# Patient Record
Sex: Female | Born: 1940 | Race: White | Hispanic: No | Marital: Married | Smoking: Never smoker
Health system: Southern US, Community
[De-identification: ages and names within clinical notes are randomized; demographics above are authoritative.]

## PROBLEM LIST (undated history)

## (undated) DIAGNOSIS — A159 Respiratory tuberculosis unspecified: Secondary | ICD-10-CM

## (undated) DIAGNOSIS — C189 Malignant neoplasm of colon, unspecified: Secondary | ICD-10-CM

## (undated) DIAGNOSIS — H409 Unspecified glaucoma: Secondary | ICD-10-CM

## (undated) DIAGNOSIS — M5430 Sciatica, unspecified side: Secondary | ICD-10-CM

## (undated) DIAGNOSIS — G459 Transient cerebral ischemic attack, unspecified: Secondary | ICD-10-CM

## (undated) DIAGNOSIS — N281 Cyst of kidney, acquired: Secondary | ICD-10-CM

## (undated) DIAGNOSIS — J45909 Unspecified asthma, uncomplicated: Secondary | ICD-10-CM

## (undated) DIAGNOSIS — K5790 Diverticulosis of intestine, part unspecified, without perforation or abscess without bleeding: Secondary | ICD-10-CM

## (undated) DIAGNOSIS — K449 Diaphragmatic hernia without obstruction or gangrene: Secondary | ICD-10-CM

## (undated) DIAGNOSIS — I10 Essential (primary) hypertension: Secondary | ICD-10-CM

## (undated) DIAGNOSIS — K635 Polyp of colon: Secondary | ICD-10-CM

## (undated) DIAGNOSIS — J439 Emphysema, unspecified: Secondary | ICD-10-CM

## (undated) DIAGNOSIS — Z5189 Encounter for other specified aftercare: Secondary | ICD-10-CM

## (undated) DIAGNOSIS — C787 Secondary malignant neoplasm of liver and intrahepatic bile duct: Secondary | ICD-10-CM

## (undated) HISTORY — DX: Essential (primary) hypertension: I10

## (undated) HISTORY — PX: CHOLECYSTECTOMY: SHX55

## (undated) HISTORY — DX: Diaphragmatic hernia without obstruction or gangrene: K44.9

## (undated) HISTORY — DX: Unspecified glaucoma: H40.9

## (undated) HISTORY — PX: COLONOSCOPY: SHX174

## (undated) HISTORY — DX: Sciatica, unspecified side: M54.30

## (undated) HISTORY — PX: TUBAL LIGATION: SHX77

## (undated) HISTORY — DX: Diverticulosis of intestine, part unspecified, without perforation or abscess without bleeding: K57.90

## (undated) HISTORY — DX: Encounter for other specified aftercare: Z51.89

## (undated) HISTORY — DX: Emphysema, unspecified: J43.9

## (undated) HISTORY — PX: APPENDECTOMY: SHX54

## (undated) HISTORY — DX: Unspecified asthma, uncomplicated: J45.909

## (undated) HISTORY — DX: Secondary malignant neoplasm of liver and intrahepatic bile duct: C78.7

## (undated) HISTORY — DX: Cyst of kidney, acquired: N28.1

## (undated) HISTORY — DX: Malignant neoplasm of colon, unspecified: C18.9

## (undated) HISTORY — DX: Polyp of colon: K63.5

## (undated) HISTORY — PX: TONSILLECTOMY: SUR1361

## (undated) HISTORY — DX: Respiratory tuberculosis unspecified: A15.9

---
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
## 2010-05-28 HISTORY — PX: COLON SURGERY: SHX602

## 2010-07-05 ENCOUNTER — Ambulatory Visit (HOSPITAL_COMMUNITY): Admission: RE | Admit: 2010-07-05 | Discharge: 2010-07-05 | Payer: Self-pay | Admitting: General Surgery

## 2010-07-18 ENCOUNTER — Ambulatory Visit (HOSPITAL_COMMUNITY): Admission: RE | Admit: 2010-07-18 | Discharge: 2010-07-18 | Payer: Self-pay | Admitting: General Surgery

## 2010-09-11 ENCOUNTER — Ambulatory Visit: Payer: Self-pay | Admitting: Genetic Counselor

## 2011-03-01 LAB — GLUCOSE, CAPILLARY: Glucose-Capillary: 109 mg/dL — ABNORMAL HIGH (ref 70–99)

## 2011-03-02 LAB — CBC
HCT: 36.4 % (ref 36.0–46.0)
Hemoglobin: 11.9 g/dL — ABNORMAL LOW (ref 12.0–15.0)
MCH: 27.9 pg (ref 26.0–34.0)
MCV: 85.2 fL (ref 78.0–100.0)
RBC: 4.27 MIL/uL (ref 3.87–5.11)

## 2011-06-27 IMAGING — PT NM PET TUM IMG INITIAL (PI) SKULL BASE T - THIGH
1 of 6 series · 1 of 25 positions shown · non-contrast
Comparison: Outside CT of the abdomen/pelvis from [facility]
dated 06/15/2010.

CLINICAL DATA: Initial treatment strategy for metastatic
adenocarcinoma of the colon.  Status post colectomy and liver
biopsy.

NUCLEAR MEDICINE PET CT SKULL BASE TO THIGH
TECHNIQUE: 16.9 mCi F-18 FDG was injected intravenously via the
right AC.  Full-ring PET imaging was performed from the skull base
through the mid-thighs 61  minutes after injection.  CT data was
obtained and used for attenuation correction and anatomic
localization only.  (This was not acquired as a diagnostic CT
examination.)
Fasting Blood Glucose:  109

[Series 2: ct images · axial · 3.8mm · 0.98mm/px · 1 of 267 slices shown]
[im 267/267  brain]
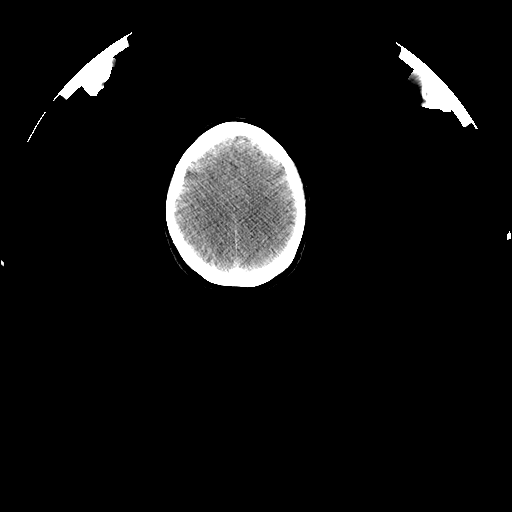

[1 of 25 positions shown; findings below may reference images not displayed]

FINDINGS: PET images demonstrate no abnormal activity within the
neck.  Focus of moderate hypermetabolism corresponds to the right
posterior hilum, primarily felt to represent the right pulmonary
artery.  No definite adenopathy on this area.  This measures a
S.U.V. max of 4.5 on image 89.  There is concurrent bilateral,
right greater than left apical and upper lobe anatomic distortion
with volume loss and bronchiectasis in the posterior aspect of the
right upper lobe.

Anterior segment right liver lobe 2.7 x 2.9 cm hypermetabolic
lesion measures a S.U.V. max of 25.2 on image 134.  This may have
enlarged slightly since the prior exam, where it measured 2.5 x
cm.

A subtle  posterior segment right liver lobe lesion measures 1.5 cm
and a S.U.V. max of 14.6 on image 141.  This may also slightly
enlarged from 7 mm on the prior diagnostic CT.  No new
hypermetabolic liver lesions. There is low level hypermetabolism
within the anterior right intraperitoneal abdomen.  This is likely
postoperative, given peritoneal/omental edema in this area.  An
area of somewhat more focal hypermetabolism is identified about the
deep portion of the right rectus musculature.  This measures a
S.U.V. max of 6.5 on approximately image 186.  This is also likely
physiologic.

A subcutaneous nodule within the lateral left flank measures 1.2 cm
and a S.U.V. max of 1.9 on image 188.

CT images performed for attenuation correction demonstrate tiny
left maxillary sinus mucous retention cyst or polyp.

No further findings within the chest.  Abdominal/pelvic findings
deferred to prior diagnostic CT.  Prior partial right
hemicolectomy.
IMPRESSION: 1.  2 hypermetabolic hepatic metastasis.  These may have enlarged
since the diagnostic CT of 06/15/2010.
[DATE].  Likely postoperative right-sided abdominal hypermetabolism.
Given concurrent omental/peritoneal nodularity/edema, peritoneal
disease cannot be entirely excluded but is felt less likely.
3.  A subcutaneous nodule within the lateral left flank is mildly
hypermetabolic but nonspecific.  Consider physical exam
correlation.
4.  Upper lobe predominant findings within the lungs which are
favored to be the sequelae of prior infection or less likely
inflammation.  Concurrent hypermetabolism which is felt to be
physiologic and correspond the right pulmonary artery. Recommend
attention on follow-up.

## 2011-12-17 HISTORY — PX: LIVER RESECTION: SHX1977

## 2011-12-26 HISTORY — PX: EXPLORATORY LAPAROTOMY: SUR591

## 2012-05-12 ENCOUNTER — Encounter: Payer: Medicare Other | Admitting: Internal Medicine

## 2012-05-12 DIAGNOSIS — C183 Malignant neoplasm of hepatic flexure: Secondary | ICD-10-CM

## 2012-05-12 DIAGNOSIS — Z452 Encounter for adjustment and management of vascular access device: Secondary | ICD-10-CM

## 2012-07-09 ENCOUNTER — Encounter: Payer: Medicare Other | Admitting: Internal Medicine

## 2012-07-09 DIAGNOSIS — C228 Malignant neoplasm of liver, primary, unspecified as to type: Secondary | ICD-10-CM

## 2012-08-27 DIAGNOSIS — C189 Malignant neoplasm of colon, unspecified: Secondary | ICD-10-CM

## 2012-08-27 DIAGNOSIS — Z452 Encounter for adjustment and management of vascular access device: Secondary | ICD-10-CM

## 2012-11-20 ENCOUNTER — Encounter: Payer: Medicare Other | Admitting: Internal Medicine

## 2012-11-20 DIAGNOSIS — C189 Malignant neoplasm of colon, unspecified: Secondary | ICD-10-CM

## 2012-11-20 DIAGNOSIS — C801 Malignant (primary) neoplasm, unspecified: Secondary | ICD-10-CM

## 2013-01-01 DIAGNOSIS — Z452 Encounter for adjustment and management of vascular access device: Secondary | ICD-10-CM

## 2013-01-01 DIAGNOSIS — C189 Malignant neoplasm of colon, unspecified: Secondary | ICD-10-CM

## 2013-02-11 DIAGNOSIS — Z452 Encounter for adjustment and management of vascular access device: Secondary | ICD-10-CM

## 2013-02-11 DIAGNOSIS — C183 Malignant neoplasm of hepatic flexure: Secondary | ICD-10-CM

## 2013-03-29 DIAGNOSIS — Z452 Encounter for adjustment and management of vascular access device: Secondary | ICD-10-CM

## 2013-03-29 DIAGNOSIS — C189 Malignant neoplasm of colon, unspecified: Secondary | ICD-10-CM

## 2013-05-06 DIAGNOSIS — C787 Secondary malignant neoplasm of liver and intrahepatic bile duct: Secondary | ICD-10-CM

## 2013-05-06 DIAGNOSIS — Z452 Encounter for adjustment and management of vascular access device: Secondary | ICD-10-CM

## 2013-05-06 DIAGNOSIS — C189 Malignant neoplasm of colon, unspecified: Secondary | ICD-10-CM

## 2013-06-16 ENCOUNTER — Encounter: Payer: Medicare Other | Admitting: Internal Medicine

## 2013-06-16 DIAGNOSIS — C189 Malignant neoplasm of colon, unspecified: Secondary | ICD-10-CM

## 2013-07-28 DIAGNOSIS — Z452 Encounter for adjustment and management of vascular access device: Secondary | ICD-10-CM

## 2013-07-28 DIAGNOSIS — C189 Malignant neoplasm of colon, unspecified: Secondary | ICD-10-CM

## 2013-09-08 DIAGNOSIS — Z452 Encounter for adjustment and management of vascular access device: Secondary | ICD-10-CM

## 2013-09-08 DIAGNOSIS — C189 Malignant neoplasm of colon, unspecified: Secondary | ICD-10-CM

## 2013-09-13 DIAGNOSIS — C189 Malignant neoplasm of colon, unspecified: Secondary | ICD-10-CM

## 2013-09-13 DIAGNOSIS — R071 Chest pain on breathing: Secondary | ICD-10-CM

## 2013-09-13 DIAGNOSIS — R1011 Right upper quadrant pain: Secondary | ICD-10-CM

## 2013-09-13 DIAGNOSIS — C787 Secondary malignant neoplasm of liver and intrahepatic bile duct: Secondary | ICD-10-CM

## 2013-09-21 ENCOUNTER — Other Ambulatory Visit (HOSPITAL_COMMUNITY): Payer: Self-pay | Admitting: Hematology and Oncology

## 2013-09-21 DIAGNOSIS — R1011 Right upper quadrant pain: Secondary | ICD-10-CM

## 2013-09-21 DIAGNOSIS — R748 Abnormal levels of other serum enzymes: Secondary | ICD-10-CM

## 2013-09-21 DIAGNOSIS — C787 Secondary malignant neoplasm of liver and intrahepatic bile duct: Secondary | ICD-10-CM

## 2013-09-21 DIAGNOSIS — C189 Malignant neoplasm of colon, unspecified: Secondary | ICD-10-CM

## 2013-10-05 ENCOUNTER — Encounter (HOSPITAL_COMMUNITY)
Admission: RE | Admit: 2013-10-05 | Discharge: 2013-10-05 | Disposition: A | Discharge status: Home / Self Care | Payer: Medicare Other | Source: Ambulatory Visit | Attending: Hematology and Oncology | Admitting: Hematology and Oncology

## 2013-10-05 DIAGNOSIS — C189 Malignant neoplasm of colon, unspecified: Secondary | ICD-10-CM | POA: Insufficient documentation

## 2013-10-05 DIAGNOSIS — C787 Secondary malignant neoplasm of liver and intrahepatic bile duct: Secondary | ICD-10-CM | POA: Insufficient documentation

## 2013-10-05 MED ORDER — FLUDEOXYGLUCOSE F - 18 (FDG) INJECTION
19.4000 | Freq: Once | INTRAVENOUS | Status: AC | PRN
  Administered 2013-10-05: 19.4 via INTRAVENOUS

## 2013-10-20 DIAGNOSIS — C787 Secondary malignant neoplasm of liver and intrahepatic bile duct: Secondary | ICD-10-CM

## 2013-10-20 DIAGNOSIS — Z452 Encounter for adjustment and management of vascular access device: Secondary | ICD-10-CM

## 2013-10-20 DIAGNOSIS — R05 Cough: Secondary | ICD-10-CM

## 2013-10-20 DIAGNOSIS — C189 Malignant neoplasm of colon, unspecified: Secondary | ICD-10-CM

## 2015-09-16 DIAGNOSIS — G459 Transient cerebral ischemic attack, unspecified: Secondary | ICD-10-CM

## 2015-09-16 HISTORY — DX: Transient cerebral ischemic attack, unspecified: G45.9

## 2019-05-14 ENCOUNTER — Telehealth: Payer: Self-pay | Admitting: *Deleted

## 2019-05-14 NOTE — Telephone Encounter (Signed)
Tele visit 6-12 3 pm with Dr Carlean Purl--  Pt is okay with Zoom visit as well [facility]

## 2019-05-14 NOTE — Telephone Encounter (Signed)
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  12/03/10: SBRT to two lesions in the liver 50 Gy/5 fxs  12/26/11: Laparotomy with IOUS of liver wedge resection and cholecystectomy  History: Emily Christian returns to clinic today for routine follow up y and has not had any evidence of disease recurrence. She was being followed by Dr. Sonny Dandy in [place], but he has retired and the [facility] center in [place] has closed. She had a colonoscopy 1 year ago, which had no evidence of disease recurrence- just some benign polyps.    She is having issues with feeling full and constipation and is wanting an OV.. I cannot see any colon reports but she can get them from [facility] in [place].  With waht we have, can we schedule her an OV with you?  Please advise, thanks for your time,  Lelan Pons

## 2019-05-14 NOTE — Telephone Encounter (Signed)
[place] YOU!!!!!!>    Do you want her to come in or do over the phone?    I truly appreciate this

## 2019-05-14 NOTE — Telephone Encounter (Signed)
I would start with a telehealth visit unless there is a reason I would need to examine her

## 2019-05-14 NOTE — Telephone Encounter (Signed)
I would be happy to see her.  Actually a bit filled up so I think mid June is next available but if I need to work her in sooner I will try.

## 2019-05-28 ENCOUNTER — Encounter: Payer: Self-pay | Admitting: Internal Medicine

## 2019-05-28 ENCOUNTER — Ambulatory Visit (INDEPENDENT_AMBULATORY_CARE_PROVIDER_SITE_OTHER): Payer: Medicare Other | Admitting: Internal Medicine

## 2019-05-28 ENCOUNTER — Other Ambulatory Visit: Payer: Self-pay

## 2019-05-28 VITALS — Ht 67.0 in | Wt 179.0 lb

## 2019-05-28 DIAGNOSIS — R194 Change in bowel habit: Secondary | ICD-10-CM | POA: Diagnosis not present

## 2019-05-28 DIAGNOSIS — Z860100 Personal history of colon polyps, unspecified: Secondary | ICD-10-CM | POA: Insufficient documentation

## 2019-05-28 DIAGNOSIS — Z85038 Personal history of other malignant neoplasm of large intestine: Secondary | ICD-10-CM | POA: Insufficient documentation

## 2019-05-28 NOTE — Progress Notes (Signed)
TELEHEALTH ENCOUNTER IN SETTING OF COVID-19 PANDEMIC - REQUESTED BY PATIENT SERVICE PROVIDED BY TELEMEDECINE - TYPE: telephone PATIENT LOCATION: Home PATIENT HAS CONSENTED TO TELEHEALTH VISIT PROVIDER LOCATION: OFFICE REFERRING PROVIDER:self PARTICIPANTS OTHER THAN PATIENT:None TIME SPENT ON CALL:16 mins    Emily Christian 78 y.o. November 01, 1941 [id number]  Assessment & Plan:   Encounter Diagnoses  Name Primary?  . Change in bowel habits Yes  . Personal history of colon cancer, stage IV   . Personal history of colonic polyps    Colonoscopy to investigate the change in bowel habits is appropriate.  I suppose her disc protrusion and nerve impingement and sciatica could have something to do with it but given her overall history I think it makes sense to proceed with a colonoscopy.  We can get records of last colonoscopy performed by Dr. Britta Mccreedy in [place] if necessary but it will not change what we do right now so that is not a pressing issue.   The risks and benefits as well as alternatives of endoscopic procedure(s) have been discussed and reviewed. All questions answered. The patient agrees to proceed. She understands and accepts the rare risk of contracting COVID-19 illness by presenting for this procedure, and understands we are employing measures for risk reduction regarding that.  I appreciate the opportunity to care for this patient.    Subjective:   Chief Complaint: Change in bowel habits  HPI This 78 year old woman has a history of right-sided colon cancer metastatic to the liver, treated with a right hemicolectomy in 2011 chemotherapy radiotherapy and also liver resection in 2013.  Dr. Sonny Dandy was her original medical oncologist.  She has been disease-free since then but on her colonoscopies has had polyps every time 1 or 2 "little polyps".  Her last colonoscopy was reported to be about 3 years ago by Dr. Britta Mccreedy when he was in [place], and she had 2 small polyps then.  She had a  recommendation to have a repeat colonoscopy at about this time.  For the past couple months or so she has had a different defecation pattern where she can go up to 2 days without defecating which is very unusual for her she says.  She would never go more than a day without defecation before.  She is having difficulty with left sciatica and had an MRI (report viewed) showing left L5-S1 disc protrusion and impingement of the nerve.  She is awaiting epidural injection to try to relieve her left-sided sciatica pain.  Physical therapy was not helpful.  She is on meloxicam and Neurontin to try to help.  GI review of systems seems negative otherwise.  She does not have any difficulty with urination other than occasional leaking. No Known Allergies Current Meds  Medication Sig  . ALBUTEROL IN Inhale into the lungs. As needed  . amLODipine (NORVASC) 5 MG tablet Take 1 tablet by mouth daily.  Marland Kitchen aspirin 81 MG chewable tablet Chew 1 tablet by mouth daily.  Marland Kitchen atorvastatin (LIPITOR) 10 MG tablet Take 1 tablet by mouth daily.  Marland Kitchen gabapentin (NEURONTIN) 300 MG capsule Take 1 capsule by mouth daily.  Marland Kitchen losartan-hydrochlorothiazide (HYZAAR) 100-12.5 MG tablet Take 1 tablet by mouth daily.  . meloxicam (MOBIC) 15 MG tablet Take 1 tablet by mouth daily.  . tizanidine (ZANAFLEX) 2 MG capsule Take 2 mg by mouth every 12 (twelve) hours as needed for muscle spasms.  Marland Kitchen umeclidinium-vilanterol (ANORO ELLIPTA) 62.5-25 MCG/INH AEPB Inhale 1 puff into the lungs daily.   Past  Medical History:  Diagnosis Date  . Colon cancer metastasized to liver ([place])   . Colon polyp    benign polyps  . Diverticulosis   . Emphysema lung ([place])   . Hiatal hernia   . Renal cyst    on MRI  . Sciatica   . TB (tuberculosis)    as a Scientist, forensic  . TIA (transient ischemic attack) 09/2015   Past Surgical History:  Procedure Laterality Date  . APPENDECTOMY    . CHOLECYSTECTOMY    . COLON SURGERY Right 05/28/2010  . EXPLORATORY  LAPAROTOMY  12/26/2011  . LIVER RESECTION  2013  . TONSILLECTOMY    . TUBAL LIGATION     Social History   Social History Narrative   Married, retired Pension scheme manager and school   Originally from [place], emigrated to the [place] in 1950 grew up in [place]   2 daughters 3 grandchildren   No alcohol tobacco or drug use, secondhand smoke exposure from her husband   family history includes Colon polyps in her daughter and daughter; Heart disease in her father; Kidney failure in her father; Muscular dystrophy in her mother.   Review of Systems All other review of systems negative.  Data reviewed include radiation and medical oncology notes at [facility], the MRI report from [facility] that was recent.

## 2019-05-28 NOTE — Patient Instructions (Addendum)
It was a pleasure talking to you on the phone today and I look forward to meeting you in person.  Because of the change in bowel habits and your history of colon cancer and colon polyps we have decided that you undergo a colonoscopy.  As we discussed, if a conflict with your planned but not scheduled epidural injection comes up we will work with you to prioritize that since you are suffering with the sciatica.  Just let us know.  Please either fax a copy of your [employer] BC/BS card to [place] at fax # [phone] or email it to [email or web address]     Thank you.   I appreciate the opportunity to care for you. Gatha Mayer, MD, [facility]

## 2019-06-03 ENCOUNTER — Telehealth: Payer: Self-pay | Admitting: Internal Medicine

## 2019-06-03 NOTE — Telephone Encounter (Signed)

## 2019-06-04 ENCOUNTER — Encounter: Payer: Self-pay | Admitting: Internal Medicine

## 2019-06-04 ENCOUNTER — Other Ambulatory Visit: Payer: Self-pay

## 2019-06-04 ENCOUNTER — Ambulatory Visit (AMBULATORY_SURGERY_CENTER): Payer: Medicare Other | Admitting: Internal Medicine

## 2019-06-04 VITALS — BP 150/71 | HR 53 | Temp 98.2°F | Resp 21 | Ht 67.0 in | Wt 179.0 lb

## 2019-06-04 DIAGNOSIS — R194 Change in bowel habit: Secondary | ICD-10-CM

## 2019-06-04 DIAGNOSIS — D124 Benign neoplasm of descending colon: Secondary | ICD-10-CM | POA: Diagnosis present

## 2019-06-04 DIAGNOSIS — D125 Benign neoplasm of sigmoid colon: Secondary | ICD-10-CM

## 2019-06-04 DIAGNOSIS — Z85038 Personal history of other malignant neoplasm of large intestine: Secondary | ICD-10-CM

## 2019-06-04 MED ORDER — SODIUM CHLORIDE 0.9 % IV SOLN: 500.0000 mL | Freq: Once | INTRAVENOUS | Status: DC

## 2019-06-04 NOTE — Progress Notes (Signed)
Report given to PACU, vss 

## 2019-06-04 NOTE — Progress Notes (Signed)
Called to room to assist during endoscopic procedure.  Patient ID and intended procedure confirmed with present staff. Received instructions for my participation in the procedure from the performing physician.  

## 2019-06-04 NOTE — Op Note (Signed)
[facility] Patient Name: Emily Christian Procedure Date: 06/04/2019 3:10 PM MRN: [id number] Endoscopist: Gatha Mayer , MD Age: 78 Referring MD:  Date of Birth: [birth year] Gender: Female Account #: [id number] Procedure:                Colonoscopy Indications:              Personal history of colonic polyps, Change in bowel                            habits Medicines:                Propofol per Anesthesia, Monitored Anesthesia Care Procedure:                Pre-Anesthesia Assessment:                           - Prior to the procedure, a History and Physical                            was performed, and patient medications and                            allergies were reviewed. The patient's tolerance of                            previous anesthesia was also reviewed. The risks                            and benefits of the procedure and the sedation                            options and risks were discussed with the patient.                            All questions were answered, and informed consent                            was obtained. Prior Anticoagulants: The patient has                            taken no previous anticoagulant or antiplatelet                            agents. ASA Grade Assessment: III - A patient with                            severe systemic disease. After reviewing the risks                            and benefits, the patient was deemed in                            satisfactory condition to undergo the procedure.  After obtaining informed consent, the colonoscope                            was passed under direct vision. Throughout the                            procedure, the patient's blood pressure, pulse, and                            oxygen saturations were monitored continuously. The                            Colonoscope was introduced through the anus and                            advanced to the the  cecum, identified by                            appendiceal orifice and ileocecal valve. The                            colonoscopy was performed without difficulty. The                            patient tolerated the procedure well. The quality                            of the bowel preparation was excellent. The                            ileocecal valve, appendiceal orifice, and rectum                            were photographed. The bowel preparation used was                            Miralax via split dose instruction. Scope In: 3:30:38 PM Scope Out: 3:43:49 PM Scope Withdrawal Time: 0 hours 10 minutes 30 seconds  Total Procedure Duration: 0 hours 13 minutes 11 seconds  Findings:                 The perianal and digital rectal examinations were                            normal.                           Two sessile polyps were found in the sigmoid colon                            and descending colon. The polyps were diminutive in                            size. These polyps were removed with a cold snare.  Resection and retrieval were complete. Verification                            of patient identification for the specimen was                            done. Estimated blood loss was minimal.                           Diverticula were found in the sigmoid colon.                           There was evidence of a prior end-to-side                            ileo-colonic anastomosis in the transverse colon.                            This was patent and was characterized by healthy                            appearing mucosa.                           The exam was otherwise without abnormality on                            direct and retroflexion views. Complications:            No immediate complications. Estimated Blood Loss:     Estimated blood loss was minimal. Impression:               - Two diminutive polyps in the sigmoid colon and in                             the descending colon, removed with a cold snare.                            Resected and retrieved.                           - Diverticulosis in the sigmoid colon.                           - Patent end-to-side ileo-colonic anastomosis,                            characterized by healthy appearing mucosa.                           - The examination was otherwise normal on direct                            and retroflexion views.                           -  Personal history of malignant neoplasm of the                            colon.                           - Personal history of colonic polyps. Recommendation:           - Patient has a contact number available for                            emergencies. The signs and symptoms of potential                            delayed complications were discussed with the                            patient. Return to normal activities tomorrow.                            Written discharge instructions were provided to the                            patient.                           - Resume previous diet.                           - Continue present medications.                           - Repeat colonoscopy is recommended for                            surveillance. The colonoscopy date will be                            determined after pathology results from today's                            exam become available for review.                           - May increase/add fiber or MiraLax if needed to                            increase frequency of defecation Gatha Mayer, MD 06/04/2019 3:55:45 PM This report has been signed electronically.

## 2019-06-04 NOTE — Patient Instructions (Addendum)
I found and removed 2 small polyps.  All else looks ok.  I will let you know pathology results and when to have another routine colonoscopy by mail and/or My Chart.  Try increased fiber or MiraLax if still having issues.  I appreciate the opportunity to care for you. Gatha Mayer, MD, [facility]  Handout on polyps and diverticulosis   YOU HAD AN ENDOSCOPIC PROCEDURE TODAY AT [facility]:   Refer to the procedure report that was given to you for any specific questions about what was found during the examination.  If the procedure report does not answer your questions, please call your gastroenterologist to clarify.  If you requested that your care partner not be given the details of your procedure findings, then the procedure report has been included in a sealed envelope for you to review at your convenience later.  YOU SHOULD EXPECT: Some feelings of bloating in the abdomen. Passage of more gas than usual.  Walking can help get rid of the air that was put into your GI tract during the procedure and reduce the bloating. If you had a lower endoscopy (such as a colonoscopy or flexible sigmoidoscopy) you may notice spotting of blood in your stool or on the toilet paper. If you underwent a bowel prep for your procedure, you may not have a normal bowel movement for a few days.  Please Note:  You might notice some irritation and congestion in your nose or some drainage.  This is from the oxygen used during your procedure.  There is no need for concern and it should clear up in a day or so.  SYMPTOMS TO REPORT IMMEDIATELY:   Following lower endoscopy (colonoscopy or flexible sigmoidoscopy):  Excessive amounts of blood in the stool  Significant tenderness or worsening of abdominal pains  Swelling of the abdomen that is new, acute  Fever of 100F or higher   For urgent or emergent issues, a gastroenterologist can be reached at any hour by calling [phone].   DIET:   We do recommend a small meal at first, but then you may proceed to your regular diet.  Drink plenty of fluids but you should avoid alcoholic beverages for 24 hours.  ACTIVITY:  You should plan to take it easy for the rest of today and you should NOT DRIVE or use heavy machinery until tomorrow (because of the sedation medicines used during the test).    FOLLOW UP: Our staff will call the number listed on your records 48-72 hours following your procedure to check on you and address any questions or concerns that you may have regarding the information given to you following your procedure. If we do not reach you, we will leave a message.  We will attempt to reach you two times.  During this call, we will ask if you have developed any symptoms of COVID 19. If you develop any symptoms (ie: fever, flu-like symptoms, shortness of breath, cough etc.) before then, please call [phone].  If you test positive for Covid 19 in the 2 weeks post procedure, please call and report this information to [place].    If any biopsies were taken you will be contacted by phone or by letter within the next 1-3 weeks.  Please call us at [phone] if you have not heard about the biopsies in 3 weeks.    SIGNATURES/CONFIDENTIALITY: You and/or your care partner have signed paperwork which will be entered into your electronic medical record.  These signatures attest to the fact that that the information above on your After Visit Summary has been reviewed and is understood.  Full responsibility of the confidentiality of this discharge information lies with you and/or your care-partner.

## 2019-06-08 ENCOUNTER — Telehealth: Payer: Self-pay | Admitting: *Deleted

## 2019-06-08 NOTE — Telephone Encounter (Signed)
  Follow up Call-  Call back number 06/04/2019  Post procedure Call Back phone  # [phone]  Permission to leave phone message Yes  Some recent data might be hidden     Patient questions:  Do you have a fever, pain , or abdominal swelling? No. Pain Score  0 *  Have you tolerated food without any problems? Yes.    Have you been able to return to your normal activities? Yes.    Do you have any questions about your discharge instructions: Diet   No. Medications  No. Follow up visit  No.  Do you have questions or concerns about your Care? No.  Actions: * If pain score is 4 or above: No action needed, pain <4.  1. Have you developed a fever since your procedure? no  2.   Have you had an respiratory symptoms (SOB or cough) since your procedure? no  3.   Have you tested positive for COVID 19 since your procedure no  4.   Have you had any family members/close contacts diagnosed with the COVID 19 since your procedure?  no   If yes to any of these questions please route to Joylene John, RN and Alphonsa Gin, Therapist, sports.

## 2019-06-10 ENCOUNTER — Encounter: Payer: Self-pay | Admitting: Internal Medicine

## 2019-06-10 DIAGNOSIS — Z86010 Personal history of colonic polyps: Secondary | ICD-10-CM

## 2019-06-10 NOTE — Progress Notes (Signed)
2 diminutive adenomas Recall 3 years and discuss colonoscopy (hx colon cancer also)

## 2022-07-03 ENCOUNTER — Encounter: Payer: Self-pay | Admitting: Internal Medicine
